# Patient Record
Sex: Female | Born: 1970 | Race: White | Hispanic: No | Marital: Married
Health system: Southern US, Community
[De-identification: ages and names within clinical notes are randomized; demographics above are authoritative.]

---

## 2021-01-31 ENCOUNTER — Other Ambulatory Visit: Payer: Self-pay

## 2021-01-31 ENCOUNTER — Encounter: Payer: Self-pay | Admitting: Family Medicine

## 2021-01-31 ENCOUNTER — Ambulatory Visit (INDEPENDENT_AMBULATORY_CARE_PROVIDER_SITE_OTHER): Payer: 59 | Admitting: Family Medicine

## 2021-01-31 ENCOUNTER — Ambulatory Visit: Payer: Self-pay

## 2021-01-31 VITALS — BP 102/64 | Ht 63.0 in | Wt 135.0 lb

## 2021-01-31 DIAGNOSIS — M6520 Calcific tendinitis, unspecified site: Secondary | ICD-10-CM | POA: Insufficient documentation

## 2021-01-31 DIAGNOSIS — M461 Sacroiliitis, not elsewhere classified: Secondary | ICD-10-CM | POA: Insufficient documentation

## 2021-01-31 DIAGNOSIS — M25852 Other specified joint disorders, left hip: Secondary | ICD-10-CM | POA: Insufficient documentation

## 2021-01-31 DIAGNOSIS — M459 Ankylosing spondylitis of unspecified sites in spine: Secondary | ICD-10-CM | POA: Insufficient documentation

## 2021-01-31 DIAGNOSIS — M533 Sacrococcygeal disorders, not elsewhere classified: Secondary | ICD-10-CM | POA: Diagnosis not present

## 2021-01-31 DIAGNOSIS — M79671 Pain in right foot: Secondary | ICD-10-CM

## 2021-01-31 NOTE — Patient Instructions (Signed)
Nice to meet you Please try ice as needed  You can consider compression on the SI Joints. You can start next week if you want to try shockwave therapy for the heel.  Please continue cushioning on the heel.    Please send me a message in MyChart with any questions or updates.  Please see me back in 4 weeks.   --Dr. Jordan Likes

## 2021-01-31 NOTE — Assessment & Plan Note (Signed)
Calcific change at the insertion of the right Achilles.  Gets worse when she runs. -Counseled on home exercise therapy and supportive care. -Padding and floated the area of irritation today. -Can start shockwave therapy.

## 2021-01-31 NOTE — Assessment & Plan Note (Signed)
Has hip abduction weakness which likely leads to the repeated exacerbation of her symptoms.  She has reported normal radiographs.  Has tried physical therapy in the past.  Is a runner and her instability with hip abduction leads to the repeated pain that she is experiencing. -Counseled on home exercise therapy and supportive care. -Bilateral injections. -Counseled on compression. -Could consider imaging

## 2021-01-31 NOTE — Progress Notes (Signed)
Mandy Foster - 50 y.o. female MRN [id number]  Date of birth: 1971/04/02  SUBJECTIVE:  Including CC & ROS.  No chief complaint on file.   Mandy Foster is a 50 y.o. female that is presenting with SI joint pain and right heel pain.  She is an avid runner and usually participates in half marathons.  She has had intermittent SI joint pain and posterior gluteal pain over the past 7 to 8 years.  She has tried [place], physical therapy, medications and injections.  She does get improvement and resolution with the injections. Resolution is always changing.  Recently she started having pain just within the first few steps of running.  She has had previous imaging done at other facilities and reported as normal.  The heel pain is intermittent in nature.  She gets swelling and redness in this area.   Review of Systems See HPI   HISTORY: Past Medical, Surgical, Social, and Family History Reviewed & Updated per EMR.   Pertinent Historical Findings include:  History reviewed. No pertinent past medical history.  History reviewed. No pertinent surgical history.  History reviewed. No pertinent family history.  Social History   Socioeconomic History  . Marital status: Married    Spouse name: Not on file  . Number of children: Not on file  . Years of education: Not on file  . Highest education level: Not on file  Occupational History  . Not on file  Tobacco Use  . Smoking status: Not on file  . Smokeless tobacco: Not on file  Substance and Sexual Activity  . Alcohol use: Not on file  . Drug use: Not on file  . Sexual activity: Not on file  Other Topics Concern  . Not on file  Social History Narrative  . Not on file   Social Determinants of Health   Financial Resource Strain: Not on file  Food Insecurity: Not on file  Transportation Needs: Not on file  Physical Activity: Not on file  Stress: Not on file  Social Connections: Not on file  Intimate Partner Violence: Not on file      PHYSICAL EXAM:  VS: BP 102/64 (BP Location: Left Arm, Patient Position: Sitting, Cuff Size: Normal)   Ht 5\' 3"  (1.6 m)   Wt 135 lb (61.2 kg)   BMI 23.91 kg/m  Physical Exam Gen: NAD, alert, cooperative with exam, well-appearing MSK:  Back: No instability with 1 leg standing. No significant instability with hip flexion and abduction. Stronger hip flexion on right compared to left. Weakness with hip abduction bilaterally Has good movement with SI joint compression. No leg length discrepancy. Right heel: Tenderness over the posterior medial aspect of the calcaneus. Normal strength resistance. Neurovascularly intact  Limited ultrasound: Right heel:  Normal-appearing mid belly Achilles. No retrocalcaneal bursitis. Calcific change at the area of her maximal tenderness.  No hyperemia in the area  Summary: Findings are suggestive of calcific tendinitis.  Ultrasound and interpretation by , MD   Aspiration/Injection Procedure Note Mandy Foster Sep 27, 1971  Procedure: Injection Indications: Right SI joint pain  Procedure Details Consent: Risks of procedure as well as the alternatives and risks of each were explained to the (patient/caregiver).  Consent for procedure obtained. Time Out: Verified patient identification, verified procedure, site/side was marked, verified correct patient position, special equipment/implants available, medications/allergies/relevent history reviewed, required imaging and test results available.  Performed.  The area was cleaned with iodine and alcohol swabs.    The right SI joint was injected using  3 cc of 1% lidocaine on a 22-gauge 3-1/2 inch needle.  The syringe was switched and a mixture containing 1 cc's of 40 mg Kenalog and 4 cc's of 0.25% bupivacaine was injected.  Ultrasound was used in order to visualize the SI joint and the needle was observed during the injection.Marland Kitchen  Ultrasound was used. Images were obtained in long views  showing the injection.     A sterile dressing was applied.  Patient did tolerate procedure well.  Aspiration/Injection Procedure Note Mandy Foster May 09, 1971  Procedure: Injection Indications: Left SI joint pain  Procedure Details Consent: Risks of procedure as well as the alternatives and risks of each were explained to the (patient/caregiver).  Consent for procedure obtained. Time Out: Verified patient identification, verified procedure, site/side was marked, verified correct patient position, special equipment/implants available, medications/allergies/relevent history reviewed, required imaging and test results available.  Performed.  The area was cleaned with iodine and alcohol swabs.    The left SI joint was injected using 3 cc of 1% lidocaine on a 22-gauge 3-1/2 inch needle.  The syringe was switched and a mixture containing 1 cc's of 40 mg Kenalog and 4 cc's of 0.25% bupivacaine was injected.  Ultrasound was used in order to visualize the SI joint and the needle was observed during the injection.Marland Kitchen  Ultrasound was used. Images were obtained in long views showing the injection.     A sterile dressing was applied.  Patient did tolerate procedure well.  ASSESSMENT & PLAN:   Calcific tendinitis Calcific change at the insertion of the right Achilles.  Gets worse when she runs. -Counseled on home exercise therapy and supportive care. -Padding and floated the area of irritation today. -Can start shockwave therapy.  SI (sacroiliac) joint dysfunction Has hip abduction weakness which likely leads to the repeated exacerbation of her symptoms.  She has reported normal radiographs.  Has tried physical therapy in the past.  Is a runner and her instability with hip abduction leads to the repeated pain that she is experiencing. -Counseled on home exercise therapy and supportive care. -Bilateral injections. -Counseled on compression. -Could consider imaging

## 2021-02-06 ENCOUNTER — Ambulatory Visit: Payer: Self-pay | Admitting: Family Medicine

## 2021-02-06 ENCOUNTER — Other Ambulatory Visit: Payer: Self-pay

## 2021-02-06 DIAGNOSIS — M6520 Calcific tendinitis, unspecified site: Secondary | ICD-10-CM

## 2021-02-06 NOTE — Progress Notes (Signed)
  Mandy Foster - 50 y.o. female MRN [id number]  Date of birth: 03-09-1971  SUBJECTIVE:  Including CC & ROS.  No chief complaint on file.   Mandy Foster is a 50 y.o. female that is here for her first shockwave therapy monitor   Review of Systems See HPI   HISTORY: Past Medical, Surgical, Social, and Family History Reviewed & Updated per EMR.   Pertinent Historical Findings include:  No past medical history on file.  No past surgical history on file.  No family history on file.  Social History   Socioeconomic History  . Marital status: Married    Spouse name: Not on file  . Number of children: Not on file  . Years of education: Not on file  . Highest education level: Not on file  Occupational History  . Not on file  Tobacco Use  . Smoking status: Not on file  . Smokeless tobacco: Not on file  Substance and Sexual Activity  . Alcohol use: Not on file  . Drug use: Not on file  . Sexual activity: Not on file  Other Topics Concern  . Not on file  Social History Narrative  . Not on file   Social Determinants of Health   Financial Resource Strain: Not on file  Food Insecurity: Not on file  Transportation Needs: Not on file  Physical Activity: Not on file  Stress: Not on file  Social Connections: Not on file  Intimate Partner Violence: Not on file     PHYSICAL EXAM:  VS: Ht 5\' 3"  (1.6 m)   Wt 135 lb (61.2 kg)   BMI 23.91 kg/m  Physical Exam Gen: NAD, alert, cooperative with exam, well-appearing   Aspiration/Injection Procedure Note Mandy Foster 17-Oct-1971  Procedure: ECSWT Indications: right heel pain   Procedure Details Consent: Risks of procedure as well as the alternatives and risks of each were explained to the (patient/caregiver).  Consent for procedure obtained. Time Out: Verified patient identification, verified procedure, site/side was marked, verified correct patient position, special equipment/implants available, medications/allergies/relevent  history reviewed, required imaging and test results available.  Performed.  The area was cleaned with iodine and alcohol swabs.    The right heel was targeted for Extracorporeal shockwave therapy.   Preset: heel spur Power Level: 40 Frequency: 10 Impulse/cycles: 2000 Head size: large    Patient did tolerate procedure well.     ASSESSMENT & PLAN:   Calcific tendinitis First shockwave therapy session today  - f/u in one week.

## 2021-02-06 NOTE — Assessment & Plan Note (Signed)
First shockwave therapy session today  - f/u in one week.

## 2021-02-13 ENCOUNTER — Ambulatory Visit: Payer: Self-pay | Admitting: Family Medicine

## 2021-02-13 ENCOUNTER — Other Ambulatory Visit: Payer: Self-pay

## 2021-02-13 DIAGNOSIS — M6520 Calcific tendinitis, unspecified site: Secondary | ICD-10-CM

## 2021-02-13 NOTE — Progress Notes (Signed)
  Mandy Foster - 50 y.o. female MRN [id number]  Date of birth: 1971/07/17  SUBJECTIVE:  Including CC & ROS.  No chief complaint on file.   Mandy Foster is a 50 y.o. female that is presenting with second shockwave therapy.   Review of Systems See HPI   HISTORY: Past Medical, Surgical, Social, and Family History Reviewed & Updated per EMR.   Pertinent Historical Findings include:  No past medical history on file.  No past surgical history on file.  No family history on file.  Social History   Socioeconomic History  . Marital status: Married    Spouse name: Not on file  . Number of children: Not on file  . Years of education: Not on file  . Highest education level: Not on file  Occupational History  . Not on file  Tobacco Use  . Smoking status: Not on file  . Smokeless tobacco: Not on file  Substance and Sexual Activity  . Alcohol use: Not on file  . Drug use: Not on file  . Sexual activity: Not on file  Other Topics Concern  . Not on file  Social History Narrative  . Not on file   Social Determinants of Health   Financial Resource Strain: Not on file  Food Insecurity: Not on file  Transportation Needs: Not on file  Physical Activity: Not on file  Stress: Not on file  Social Connections: Not on file  Intimate Partner Violence: Not on file     PHYSICAL EXAM:  VS: Ht 5\' 3"  (1.6 m)   Wt 135 lb (61.2 kg)   BMI 23.91 kg/m  Physical Exam Gen: NAD, alert, cooperative with exam, well-appearing  Aspiration/Injection Procedure Note Emily Massar 1970/12/27  Procedure: ECSWT Indications: right heel pain   Procedure Details Consent: Risks of procedure as well as the alternatives and risks of each were explained to the (patient/caregiver).  Consent for procedure obtained. Time Out: Verified patient identification, verified procedure, site/side was marked, verified correct patient position, special equipment/implants available, medications/allergies/relevent  history reviewed, required imaging and test results available.  Performed.  The area was cleaned with iodine and alcohol swabs.    The right heel was targeted for Extracorporeal shockwave therapy.   Preset: heel spur Power Level: 50 Frequency: 10 Impulse/cycles: 2100 Head size: large    Patient did tolerate procedure well.     ASSESSMENT & PLAN:   Calcific tendinitis Second shockwave therapy today.  - f/u in 1-2 weeks.

## 2021-02-13 NOTE — Assessment & Plan Note (Signed)
Second shockwave therapy today.  - f/u in 1-2 weeks.

## 2021-02-13 NOTE — Patient Instructions (Signed)
Good to see you  Please send me a message in MyChart with any questions or updates.  Please see me back in 1 week.   --Dr. Schmitz  

## 2021-02-27 ENCOUNTER — Other Ambulatory Visit: Payer: Self-pay

## 2021-02-27 ENCOUNTER — Ambulatory Visit (HOSPITAL_BASED_OUTPATIENT_CLINIC_OR_DEPARTMENT_OTHER)
Admission: RE | Admit: 2021-02-27 | Discharge: 2021-02-27 | Disposition: A | Discharge status: Home / Self Care | Payer: 59 | Source: Ambulatory Visit | Attending: Family Medicine | Admitting: Family Medicine

## 2021-02-27 ENCOUNTER — Encounter (HOSPITAL_BASED_OUTPATIENT_CLINIC_OR_DEPARTMENT_OTHER): Payer: Self-pay

## 2021-02-27 ENCOUNTER — Ambulatory Visit: Payer: 59 | Admitting: Family Medicine

## 2021-02-27 DIAGNOSIS — M6520 Calcific tendinitis, unspecified site: Secondary | ICD-10-CM

## 2021-02-27 DIAGNOSIS — M25852 Other specified joint disorders, left hip: Secondary | ICD-10-CM

## 2021-02-27 NOTE — Assessment & Plan Note (Addendum)
Reoccurring pain of the SI joints in the posterior hip.  Acute on chronic in nature.  She has been experiencing this pain for 10 years. -X-ray. -MRI of the pelvis to evaluate for posterior hip impingement vs underlying inflammatory changes.

## 2021-02-27 NOTE — Progress Notes (Signed)
  Mandy Foster - 50 y.o. female MRN [id number]  Date of birth: 1971/08/03  SUBJECTIVE:  Including CC & ROS.  No chief complaint on file.   Mandy Foster is a 50 y.o. female that is presenting for the third shockwave therapy.  She is also having recurrent posterior hip pain.   Review of Systems See HPI   HISTORY: Past Medical, Surgical, Social, and Family History Reviewed & Updated per EMR.   Pertinent Historical Findings include:  No past medical history on file.  No past surgical history on file.  No family history on file.  Social History   Socioeconomic History  . Marital status: Married    Spouse name: Not on file  . Number of children: Not on file  . Years of education: Not on file  . Highest education level: Not on file  Occupational History  . Not on file  Tobacco Use  . Smoking status: Not on file  . Smokeless tobacco: Not on file  Substance and Sexual Activity  . Alcohol use: Not on file  . Drug use: Not on file  . Sexual activity: Not on file  Other Topics Concern  . Not on file  Social History Narrative  . Not on file   Social Determinants of Health   Financial Resource Strain: Not on file  Food Insecurity: Not on file  Transportation Needs: Not on file  Physical Activity: Not on file  Stress: Not on file  Social Connections: Not on file  Intimate Partner Violence: Not on file     PHYSICAL EXAM:  VS: Ht 5\' 3"  (1.6 m)   Wt 135 lb (61.2 kg)   LMP 02/13/2021   BMI 23.91 kg/m  Physical Exam Gen: NAD, alert, cooperative with exam, well-appearing MSK:  Pelvis: Tenderness palpation of the SI joints. Normal strength resistance. Neurovascularly intact  Aspiration/Injection Procedure Note Mandy Foster 08/11/1971  Procedure: ECSWT Indications: right heel pain   Procedure Details Consent: Risks of procedure as well as the alternatives and risks of each were explained to the (patient/caregiver).  Consent for procedure obtained. Time Out:  Verified patient identification, verified procedure, site/side was marked, verified correct patient position, special equipment/implants available, medications/allergies/relevent history reviewed, required imaging and test results available.  Performed.  The area was cleaned with iodine and alcohol swabs.    The right heel was targeted for Extracorporeal shockwave therapy.   Preset: heel spur Power Level: 40 Frequency: 10 Impulse/cycles: 2300 Head size: large    Patient did tolerate procedure well.    ASSESSMENT & PLAN:   Hip impingement syndrome, left Reoccurring pain of the SI joints in the posterior hip.  Acute on chronic in nature.  She has been experiencing this pain for 10 years. -X-ray. -MRI of the pelvis to evaluate for posterior hip impingement vs underlying inflammatory changes.   Calcific tendinitis Completed third shockwave therapy.

## 2021-02-28 ENCOUNTER — Telehealth: Payer: Self-pay | Admitting: Family Medicine

## 2021-02-28 NOTE — Assessment & Plan Note (Signed)
Completed third shockwave therapy.

## 2021-02-28 NOTE — Telephone Encounter (Signed)
Informed of results.   Myra Rude, MD [facility] 02/28/2021, 1:46 PM

## 2021-03-13 ENCOUNTER — Other Ambulatory Visit: Payer: Self-pay

## 2021-03-13 ENCOUNTER — Ambulatory Visit
Admission: RE | Admit: 2021-03-13 | Discharge: 2021-03-13 | Disposition: A | Discharge status: Home / Self Care | Payer: 59 | Source: Ambulatory Visit | Attending: Family Medicine | Admitting: Family Medicine

## 2021-03-13 ENCOUNTER — Ambulatory Visit: Payer: 59 | Admitting: Family Medicine

## 2021-03-13 DIAGNOSIS — M6520 Calcific tendinitis, unspecified site: Secondary | ICD-10-CM

## 2021-03-13 DIAGNOSIS — M25852 Other specified joint disorders, left hip: Secondary | ICD-10-CM

## 2021-03-13 NOTE — Assessment & Plan Note (Signed)
Completed 4th shockwave therapy today.  - f/u in 4 weeks.

## 2021-03-13 NOTE — Progress Notes (Signed)
  Mandy Foster - 50 y.o. female MRN [id number]  Date of birth: 1971-08-07  SUBJECTIVE:  Including CC & ROS.  No chief complaint on file.   Mandy Foster is a 50 y.o. female that is here for her 4th shockwave therapy.    Review of Systems See HPI   HISTORY: Past Medical, Surgical, Social, and Family History Reviewed & Updated per EMR.   Pertinent Historical Findings include:  No past medical history on file.  No past surgical history on file.  No family history on file.  Social History   Socioeconomic History  . Marital status: Married    Spouse name: Not on file  . Number of children: Not on file  . Years of education: Not on file  . Highest education level: Not on file  Occupational History  . Not on file  Tobacco Use  . Smoking status: Not on file  . Smokeless tobacco: Not on file  Substance and Sexual Activity  . Alcohol use: Not on file  . Drug use: Not on file  . Sexual activity: Not on file  Other Topics Concern  . Not on file  Social History Narrative  . Not on file   Social Determinants of Health   Financial Resource Strain: Not on file  Food Insecurity: Not on file  Transportation Needs: Not on file  Physical Activity: Not on file  Stress: Not on file  Social Connections: Not on file  Intimate Partner Violence: Not on file     PHYSICAL EXAM:  VS: Ht 5\' 3"  (1.6 m)   Wt 135 lb (61.2 kg)   LMP 02/13/2021   BMI 23.91 kg/m  Physical Exam Gen: NAD, alert, cooperative with exam, well-appearing   Aspiration/Injection Procedure Note Mandy Foster 15-Mar-1971  Procedure: ECSWT Indications: right heel pain   Procedure Details Consent: Risks of procedure as well as the alternatives and risks of each were explained to the (patient/caregiver).  Consent for procedure obtained. Time Out: Verified patient identification, verified procedure, site/side was marked, verified correct patient position, special equipment/implants available,  medications/allergies/relevent history reviewed, required imaging and test results available.  Performed.  The area was cleaned with iodine and alcohol swabs.    The right heel was targeted for Extracorporeal shockwave therapy.   Preset: heel spur Power Level: 50 Frequency: 10 Impulse/cycles: 2300 Head size: large    Patient did tolerate procedure well.     ASSESSMENT & PLAN:   Calcific tendinitis Completed 4th shockwave therapy today.  - f/u in 4 weeks.

## 2021-03-14 ENCOUNTER — Telehealth (INDEPENDENT_AMBULATORY_CARE_PROVIDER_SITE_OTHER): Payer: 59 | Admitting: Family Medicine

## 2021-03-14 DIAGNOSIS — M461 Sacroiliitis, not elsewhere classified: Secondary | ICD-10-CM | POA: Diagnosis not present

## 2021-03-14 NOTE — Assessment & Plan Note (Signed)
Acute on chronic in nature.  She does have other markers for possible ankylosing spondylitis.  Does have heel pain and alternating buttock pain with asymmetric arthritis and response to anti-inflammatories. -Counseled on home exercise therapy and supportive care. -Celebrex. -Sed rate, CRP, ANA panel, HLA-B27.

## 2021-03-14 NOTE — Progress Notes (Signed)
Virtual Visit via Video Note  I connected with Mandy Foster on 03/14/21 at  8:00 AM EDT by a video enabled telemedicine application and verified that I am speaking with the correct person using two identifiers.  Location: Patient: home Provider: office   I discussed the limitations of evaluation and management by telemedicine and the availability of in person appointments. The patient expressed understanding and agreed to proceed.  History of Present Illness:  [name] is a 50 year old female that is following up after an MRI of her pelvis.  This was revealing for sacroiliitis.  Observations/Objective:  Gen: NAD, alert, cooperative with exam, well-appearing   Assessment and Plan:  Sacroiliitis: Acute on chronic in nature.  She does have other markers for possible ankylosing spondylitis.  Does have heel pain and alternating buttock pain with asymmetric arthritis and response to anti-inflammatories. -Counseled on home exercise therapy and supportive care. -Celebrex. -Sed rate, CRP, ANA panel, HLA-B27.  Follow Up Instructions:    I discussed the assessment and treatment plan with the patient. The patient was provided an opportunity to ask questions and all were answered. The patient agreed with the plan and demonstrated an understanding of the instructions.   The patient was advised to call back or seek an in-person evaluation if the symptoms worsen or if the condition fails to improve as anticipated.    Clearance Coots, MD

## 2021-03-15 ENCOUNTER — Telehealth: Payer: Self-pay | Admitting: Family Medicine

## 2021-03-15 MED ORDER — CELECOXIB 200 MG PO CAPS: ORAL_CAPSULE | ORAL | 2 refills | Status: DC

## 2021-03-15 NOTE — Telephone Encounter (Signed)
Patient called states provider said a Rx for Celebraex can be prescribed for her & at 1st she said no but now ask for Dr. Jordan Likes to send one to [facility] : address @ [address] [place] .Marland Kitchen  Please call pt if there are any questions or concerns  --glh

## 2021-03-21 ENCOUNTER — Telehealth: Payer: Self-pay | Admitting: Family Medicine

## 2021-03-21 NOTE — Telephone Encounter (Signed)
Spoke with patient.   Myra Rude, MD [facility] 03/21/2021, 1:38 PM

## 2021-03-21 NOTE — Telephone Encounter (Signed)
Pt called states poss side effects of :  celecoxib (CELEBREX) 200 MG capsule [[id number]]   Order Details Dose, Route, Frequency: As Directed  Dispense Quantity: 60 capsule Refills: 2       Sig: One to 2 tablets by mouth daily as needed for pain.       --Per patient some irregualar vaginal bleeding,diarrhea, & sore throat.  --Patient  Request a call back @ Phone: [phone]  --Forwarding message to provider.  --glh

## 2021-03-23 ENCOUNTER — Telehealth: Payer: Self-pay | Admitting: Family Medicine

## 2021-03-23 LAB — HLA-B27 ANTIGEN: HLA B27: POSITIVE

## 2021-03-23 LAB — SEDIMENTATION RATE: Sed Rate: 2 mm/hr (ref 0–32)

## 2021-03-23 LAB — ANA,IFA RA DIAG PNL W/RFLX TIT/PATN
ANA Titer 1: NEGATIVE
Cyclic Citrullin Peptide Ab: 2 units (ref 0–19)
Rheumatoid fact SerPl-aCnc: <10 IU/mL (ref <14.0)

## 2021-03-23 LAB — C-REACTIVE PROTEIN: CRP: 2 mg/L (ref 0–10)

## 2021-03-23 NOTE — Telephone Encounter (Signed)
Informed of results.  She wants to maintain on the lead currently and hold off on seeing rheumatology.  Myra Rude, MD [facility] 03/23/2021, 4:09 PM

## 2021-03-27 ENCOUNTER — Encounter: Payer: Self-pay | Admitting: Family Medicine

## 2021-03-28 ENCOUNTER — Ambulatory Visit: Payer: Self-pay

## 2021-03-28 ENCOUNTER — Encounter: Payer: Self-pay | Admitting: Family Medicine

## 2021-03-28 ENCOUNTER — Ambulatory Visit (INDEPENDENT_AMBULATORY_CARE_PROVIDER_SITE_OTHER): Payer: 59 | Admitting: Family Medicine

## 2021-03-28 ENCOUNTER — Other Ambulatory Visit: Payer: Self-pay

## 2021-03-28 DIAGNOSIS — M458 Ankylosing spondylitis sacral and sacrococcygeal region: Secondary | ICD-10-CM | POA: Diagnosis not present

## 2021-03-28 MED ORDER — TRIAMCINOLONE ACETONIDE 40 MG/ML IJ SUSP
40.0000 mg | Freq: Once | INTRAMUSCULAR | Status: AC
  Administered 2021-03-28: 40 mg via INTRA_ARTICULAR

## 2021-03-28 NOTE — Progress Notes (Signed)
Mandy Foster - 50 y.o. female MRN [id number]  Date of birth: October 23, 1971  SUBJECTIVE:  Including CC & ROS.  No chief complaint on file.   Mandy Foster is a 50 y.o. female that is presenting with acute on chronic pain over the SI joints.  Pain is gotten severe to where she is not able to do her normal activities.  Recent lab work was confirming ankylosing spondylitis with a positive HLA-B27.   Review of Systems See HPI   HISTORY: Past Medical, Surgical, Social, and Family History Reviewed & Updated per EMR.   Pertinent Historical Findings include:  History reviewed. No pertinent past medical history.  History reviewed. No pertinent surgical history.  History reviewed. No pertinent family history.  Social History   Socioeconomic History  . Marital status: Married    Spouse name: Not on file  . Number of children: Not on file  . Years of education: Not on file  . Highest education level: Not on file  Occupational History  . Not on file  Tobacco Use  . Smoking status: Not on file  . Smokeless tobacco: Not on file  Substance and Sexual Activity  . Alcohol use: Not on file  . Drug use: Not on file  . Sexual activity: Not on file  Other Topics Concern  . Not on file  Social History Narrative  . Not on file   Social Determinants of Health   Financial Resource Strain: Not on file  Food Insecurity: Not on file  Transportation Needs: Not on file  Physical Activity: Not on file  Stress: Not on file  Social Connections: Not on file  Intimate Partner Violence: Not on file     PHYSICAL EXAM:  VS: BP 108/74 (BP Location: Left Arm, Patient Position: Sitting, Cuff Size: Normal)   Ht _0  (1.6 m)   Wt 135 lb (61.2 kg)   LMP 02/13/2021   BMI 23.91 kg/m  Physical Exam Gen: NAD, alert, cooperative with exam, well-appearing   Aspiration/Injection Procedure Note Mandy Foster 03-12-1971  Procedure: Injection Indications: Right SI joint pain  Procedure  Details Consent: Risks of procedure as well as the alternatives and risks of each were explained to the (patient/caregiver).  Consent for procedure obtained. Time Out: Verified patient identification, verified procedure, site/side was marked, verified correct patient position, special equipment/implants available, medications/allergies/relevent history reviewed, required imaging and test results available.  Performed.  The area was cleaned with iodine and alcohol swabs.    The right SI joint was injected using 3 cc of 1% lidocaine on a 22-gauge 3-1/2 inch needle.  The syringe was switched and a mixture containing 1 cc's of 40 mg Kenalog and 4 cc's of 0.25% bupivacaine was injected.  Ultrasound was used in order to visualize the SI joint and the needle was observed during the injection.Marland Kitchen  Ultrasound was used. Images were obtained in long views showing the injection.     A sterile dressing was applied.  Patient did tolerate procedure well.  Aspiration/Injection Procedure Note Mandy Foster 11-14-1971  Procedure: Injection Indications: Left SI joint pain  Procedure Details Consent: Risks of procedure as well as the alternatives and risks of each were explained to the (patient/caregiver).  Consent for procedure obtained. Time Out: Verified patient identification, verified procedure, site/side was marked, verified correct patient position, special equipment/implants available, medications/allergies/relevent history reviewed, required imaging and test results available.  Performed.  The area was cleaned with iodine and alcohol swabs.    The left SI joint was  injected using 3 cc of 1% lidocaine on a 22-gauge 3-1/2 inch needle.  The syringe was switched and a mixture containing 1 cc's of 40 mg Kenalog and 4 cc's of 0.25% bupivacaine was injected.  Ultrasound was used in order to visualize the SI joint and the needle was observed during the injection.Marland Kitchen  Ultrasound was used. Images were obtained  in long views showing the injection.     A sterile dressing was applied.  Patient did tolerate procedure well.   ASSESSMENT & PLAN:   Ankylosing spondylitis ([place]) Acutely worsening.  Labwork confirming sacroiliitis. -Counseled on home exercise therapy and supportive care. -Try to manage with Celebrex.  If pain is not controlled and may need to consider referral to rheumatology. -Bilateral injections of the SI joints today

## 2021-03-28 NOTE — Patient Instructions (Signed)
Good to see you Please let me know how the celebrex does  Please use ice as needed  Please send me a message in MyChart with any questions or updates.  Please see me back in 4-6 weeks.   --Dr. Jordan Likes

## 2021-03-28 NOTE — Assessment & Plan Note (Addendum)
Acutely worsening.  Labwork confirming sacroiliitis. -Counseled on home exercise therapy and supportive care. -Try to manage with Celebrex.  If pain is not controlled and may need to consider referral to rheumatology. -Bilateral injections of the SI joints today

## 2021-05-30 ENCOUNTER — Encounter: Payer: Self-pay | Admitting: Family Medicine

## 2021-06-26 ENCOUNTER — Other Ambulatory Visit: Payer: Self-pay | Admitting: Family Medicine

## 2021-07-06 ENCOUNTER — Encounter: Payer: Self-pay | Admitting: Family Medicine

## 2021-07-07 ENCOUNTER — Other Ambulatory Visit: Payer: Self-pay | Admitting: *Deleted

## 2021-07-07 MED ORDER — CELECOXIB 200 MG PO CAPS: ORAL_CAPSULE | ORAL | 0 refills | Status: AC

## 2021-10-01 IMAGING — MR MR PELVIS W/O CM
4 of 5 series · 31 of 48 positions shown · non-contrast
Comparison: Single-view of the pelvis 02/27/2021.

CLINICAL DATA: Chronic bilateral hip and SI joint pain. No known
injury.

EXAM:
MRI PELVIS WITHOUT CONTRAST
TECHNIQUE: Multiplanar multisequence MR imaging of the pelvis was performed. No
intravenous contrast was administered.

[Series 3: T2 fat-sat · sagittal · 4.0mm · 0.47mm/px · 9 of 62 slices shown]
[im 1/62]
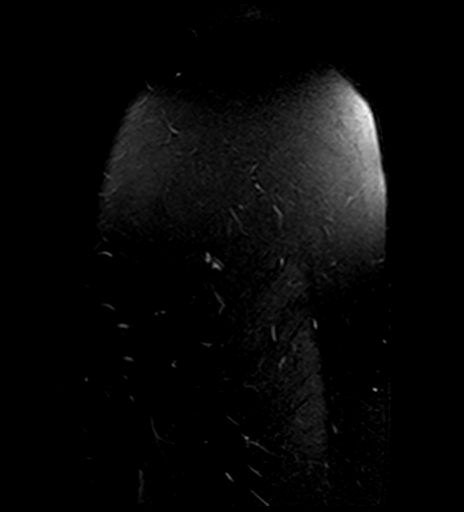
[im 12/62]
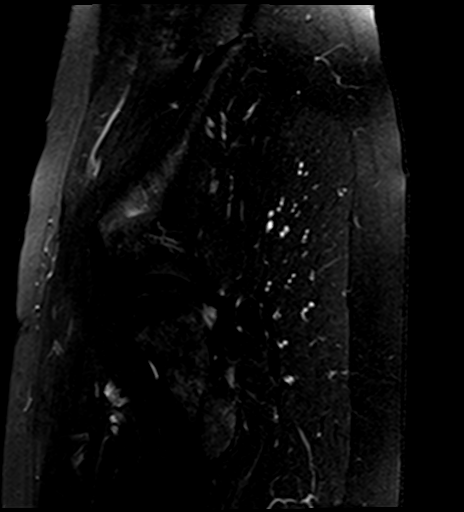
[im 17/62]
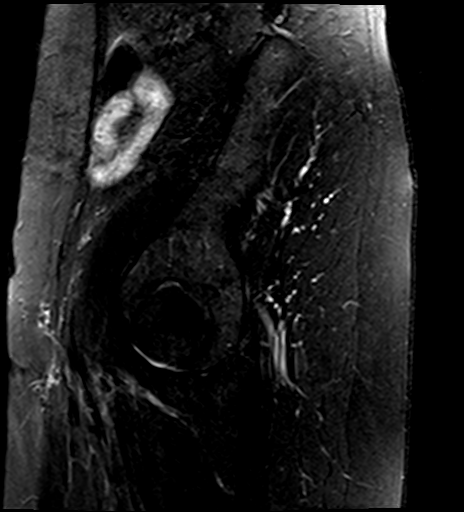
[im 28/62]
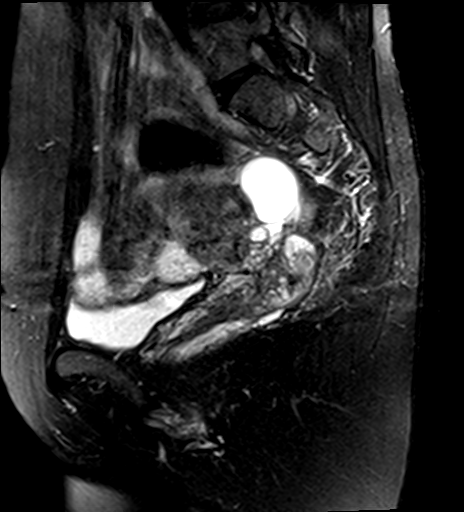
[im 34/62]
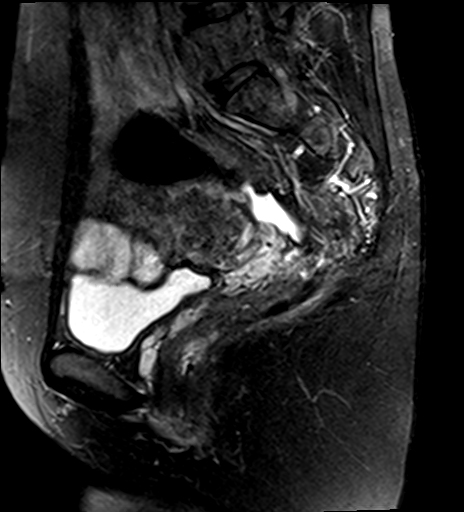
[im 45/62]
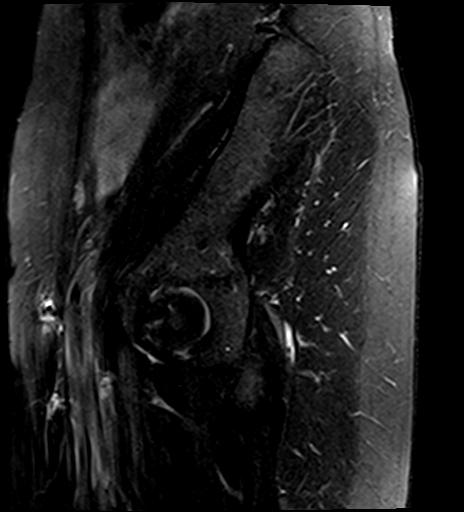
[im 50/62]
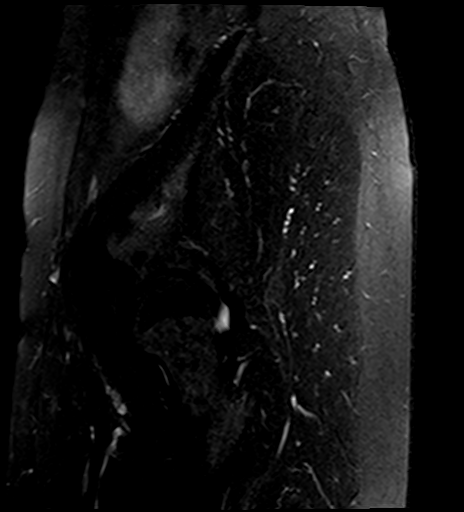
[im 56/62]
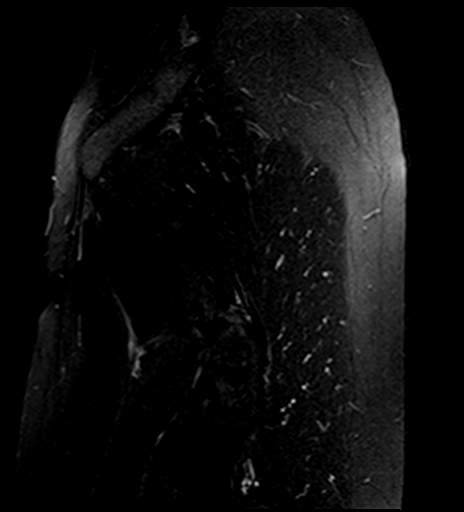
[im 62/62]
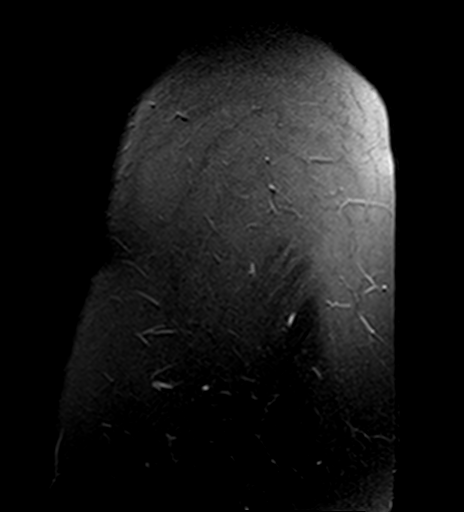

[Series 4: T1 · coronal · 4.0mm · 1.56mm/px · 8 of 36 slices shown (1 of 2)]
[im 1/36]
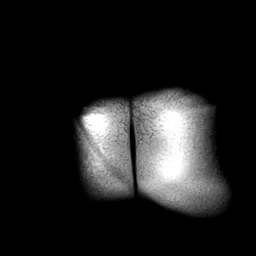
[im 6/36]
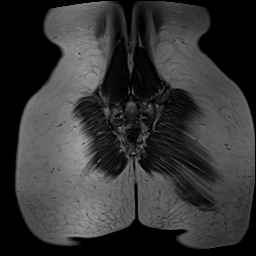
[im 11/36]
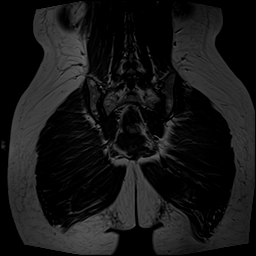
[im 16/36]
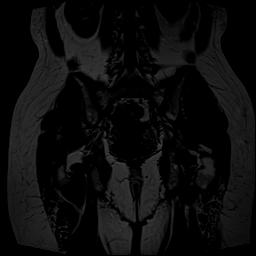
[im 21/36]
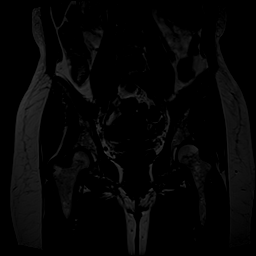
[im 26/36]
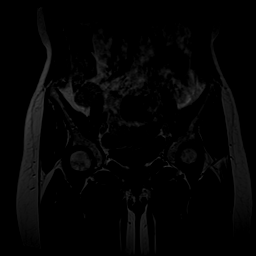
[im 31/36]
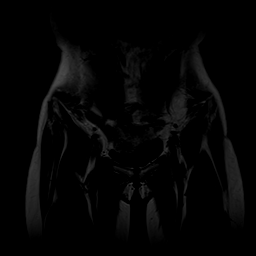
[im 36/36]
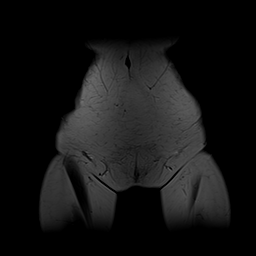

[Series 5: STIR · coronal · 4.0mm · 1.56mm/px · 8 of 36 slices shown]
[im 1/36]
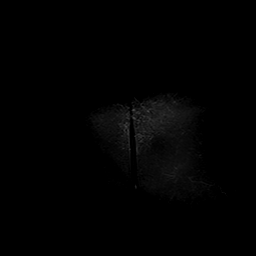
[im 6/36]
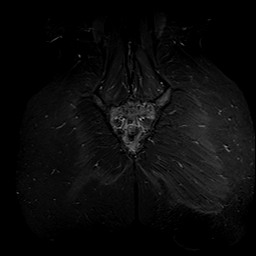
[im 11/36]
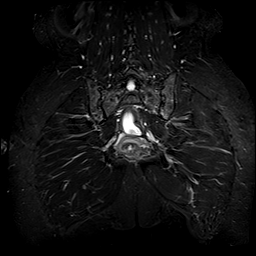
[im 16/36]
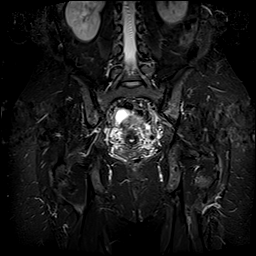
[im 21/36]
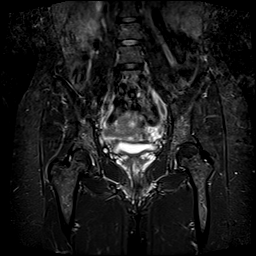
[im 26/36]
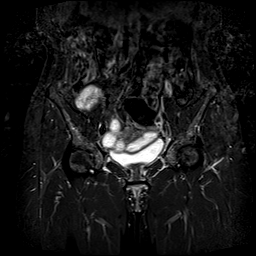
[im 31/36]
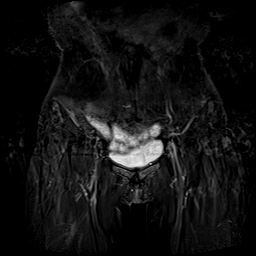
[im 36/36]
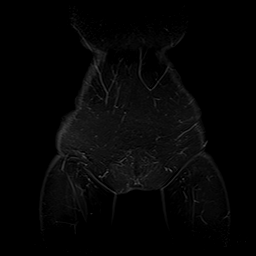

[Series 6: T1 · axial · 4.0mm · 0.62mm/px · z∈[-117,+78]mm · 6 of 45 slices shown (2 of 2)]
[im 1/45]
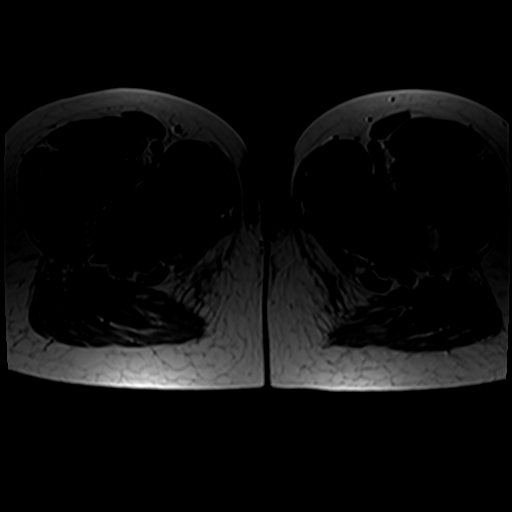
[im 5/45]
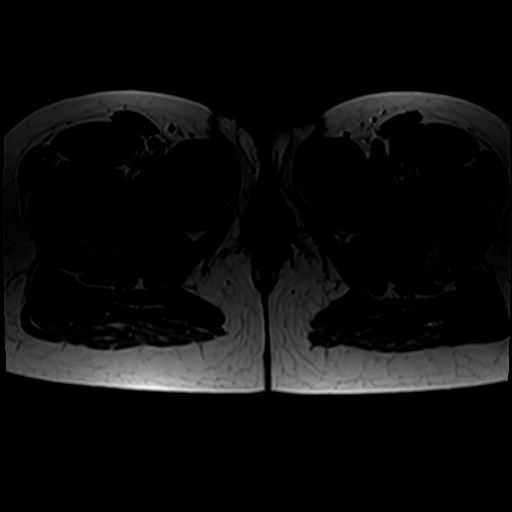
[im 15/45]
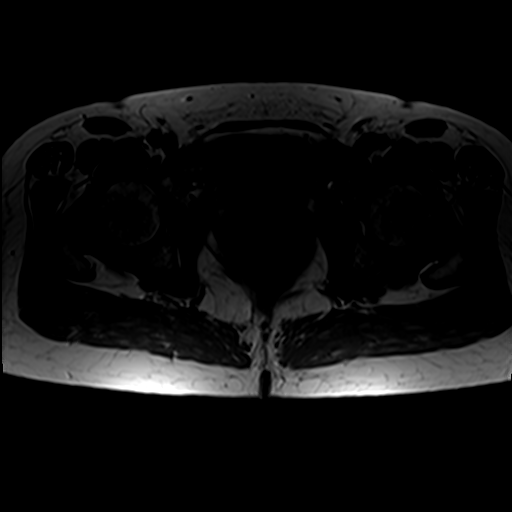
[im 20/45]
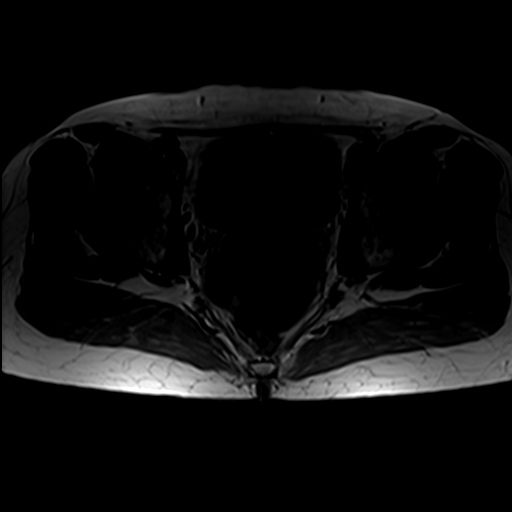
[im 25/45]
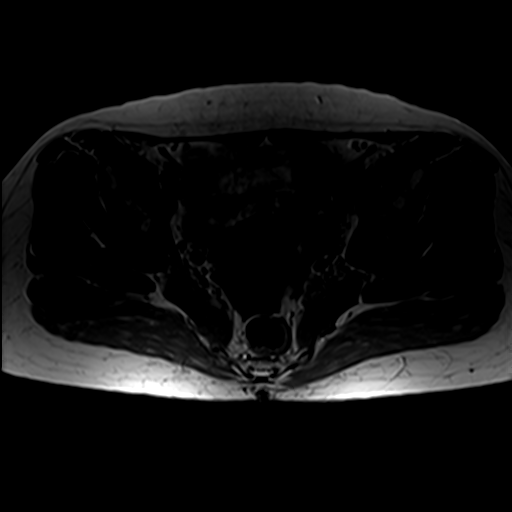
[im 40/45]
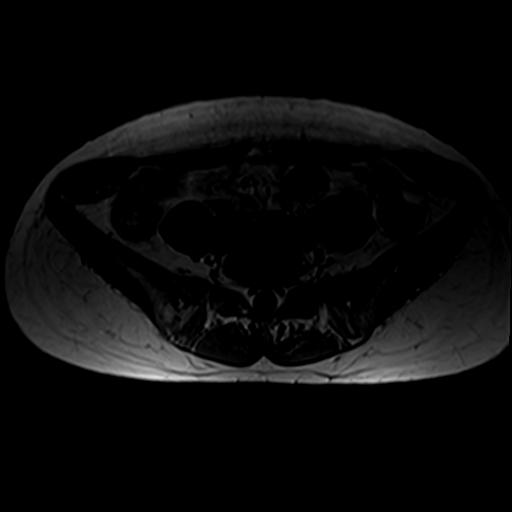

[31 of 48 positions shown; findings below may reference images not displayed]

FINDINGS: Bones/Joint/Cartilage

No fracture, stress change or worrisome lesion is identified. There
are subchondral erosions about both SI joints and small reactive
fatty deposits about both joints. Mild edema is seen in the inferior
aspect of both SI joints, slightly worse on the left. No SI joint
effusion is identified.

The hips appear normal with preserved joint spaces, no erosion,
effusion, subchondral cyst formation or edema. There is no avascular
necrosis of the femoral heads. Limited visualization of the lower
lumbar spine is unremarkable.

Ligaments

Appear intact.

Muscles and Tendons

Intact and normal in appearance.

Soft tissues

There is a small volume of free pelvic fluid which is likely due to
physiologic change. Imaged intrapelvic contents are otherwise
unremarkable.
IMPRESSION: The examination is positive for mild to moderate appearing bilateral
sacroiliitis which appears symmetric from right to left. No other
abnormality is identified.

## 2023-03-11 ENCOUNTER — Encounter: Payer: Self-pay | Admitting: *Deleted
